# Patient Record
Sex: Female | Born: 2000 | Race: Black or African American | Hispanic: No | Marital: Single | Smoking: Never smoker
Health system: Southern US, Community
[De-identification: ages and names within clinical notes are randomized; demographics above are authoritative.]

## PROBLEM LIST (undated history)

## (undated) DIAGNOSIS — J45909 Unspecified asthma, uncomplicated: Secondary | ICD-10-CM

---

## 2020-02-13 ENCOUNTER — Emergency Department (HOSPITAL_COMMUNITY): Payer: 59

## 2020-02-13 ENCOUNTER — Emergency Department (HOSPITAL_COMMUNITY)
Admission: EM | Admit: 2020-02-13 | Discharge: 2020-02-13 | Disposition: A | Discharge status: Home / Self Care | Payer: 59 | Attending: Emergency Medicine | Admitting: Emergency Medicine

## 2020-02-13 ENCOUNTER — Other Ambulatory Visit: Payer: Self-pay

## 2020-02-13 ENCOUNTER — Ambulatory Visit (HOSPITAL_COMMUNITY)
Admission: EM | Admit: 2020-02-13 | Discharge: 2020-02-13 | Disposition: A | Discharge status: Home / Self Care | Payer: 59 | Source: Home / Self Care

## 2020-02-13 ENCOUNTER — Encounter (HOSPITAL_COMMUNITY): Payer: Self-pay | Admitting: Emergency Medicine

## 2020-02-13 DIAGNOSIS — W182XXA Fall in (into) shower or empty bathtub, initial encounter: Secondary | ICD-10-CM | POA: Insufficient documentation

## 2020-02-13 DIAGNOSIS — M25512 Pain in left shoulder: Secondary | ICD-10-CM | POA: Insufficient documentation

## 2020-02-13 DIAGNOSIS — R55 Syncope and collapse: Secondary | ICD-10-CM | POA: Diagnosis not present

## 2020-02-13 DIAGNOSIS — G43909 Migraine, unspecified, not intractable, without status migrainosus: Secondary | ICD-10-CM | POA: Insufficient documentation

## 2020-02-13 DIAGNOSIS — J45909 Unspecified asthma, uncomplicated: Secondary | ICD-10-CM | POA: Diagnosis not present

## 2020-02-13 DIAGNOSIS — W19XXXA Unspecified fall, initial encounter: Secondary | ICD-10-CM

## 2020-02-13 DIAGNOSIS — M25552 Pain in left hip: Secondary | ICD-10-CM | POA: Diagnosis not present

## 2020-02-13 DIAGNOSIS — G43009 Migraine without aura, not intractable, without status migrainosus: Secondary | ICD-10-CM

## 2020-02-13 HISTORY — DX: Unspecified asthma, uncomplicated: J45.909

## 2020-02-13 LAB — COMPREHENSIVE METABOLIC PANEL
ALT: 16 U/L (ref 0–44)
AST: 17 U/L (ref 15–41)
Albumin: 3.4 g/dL — ABNORMAL LOW (ref 3.5–5.0)
Alkaline Phosphatase: 80 U/L (ref 38–126)
Anion gap: 8 (ref 5–15)
BUN: 8 mg/dL (ref 6–20)
CO2: 22 mmol/L (ref 22–32)
Calcium: 8.9 mg/dL (ref 8.9–10.3)
Chloride: 108 mmol/L (ref 98–111)
Creatinine, Ser: 0.72 mg/dL (ref 0.44–1.00)
GFR calc Af Amer: >60 mL/min (ref >60)
GFR calc non Af Amer: >60 mL/min (ref >60)
Glucose, Bld: 80 mg/dL (ref 70–99)
Potassium: 3.7 mmol/L (ref 3.5–5.1)
Sodium: 138 mmol/L (ref 135–145)
Total Bilirubin: 0.3 mg/dL (ref 0.3–1.2)
Total Protein: 6.9 g/dL (ref 6.5–8.1)

## 2020-02-13 LAB — CBC WITH DIFFERENTIAL/PLATELET
Abs Immature Granulocytes: 0.01 10*3/uL (ref 0.00–0.07)
Basophils Absolute: 0.1 10*3/uL (ref 0.0–0.1)
Basophils Relative: 1 %
Eosinophils Absolute: 1 10*3/uL — ABNORMAL HIGH (ref 0.0–0.5)
Eosinophils Relative: 9 %
HCT: 37.2 % (ref 36.0–46.0)
Hemoglobin: 11.1 g/dL — ABNORMAL LOW (ref 12.0–15.0)
Immature Granulocytes: 0 %
Lymphocytes Relative: 32 %
Lymphs Abs: 3.5 10*3/uL (ref 0.7–4.0)
MCH: 21 pg — ABNORMAL LOW (ref 26.0–34.0)
MCHC: 29.8 g/dL — ABNORMAL LOW (ref 30.0–36.0)
MCV: 70.3 fL — ABNORMAL LOW (ref 80.0–100.0)
Monocytes Absolute: 0.7 10*3/uL (ref 0.1–1.0)
Monocytes Relative: 7 %
Neutro Abs: 5.8 10*3/uL (ref 1.7–7.7)
Neutrophils Relative %: 51 %
Platelets: 481 10*3/uL — ABNORMAL HIGH (ref 150–400)
RBC: 5.29 MIL/uL — ABNORMAL HIGH (ref 3.87–5.11)
RDW: 15.9 % — ABNORMAL HIGH (ref 11.5–15.5)
WBC: 11.1 10*3/uL — ABNORMAL HIGH (ref 4.0–10.5)
nRBC: 0 % (ref 0.0–0.2)

## 2020-02-13 LAB — I-STAT BETA HCG BLOOD, ED (MC, WL, AP ONLY): I-stat hCG, quantitative: <5 m[IU]/mL (ref <5)

## 2020-02-13 MED ORDER — IBUPROFEN 800 MG PO TABS: 800.0000 mg | ORAL_TABLET | Freq: Three times a day (TID) | ORAL | 0 refills | Status: DC

## 2020-02-13 MED ORDER — KETOROLAC TROMETHAMINE 30 MG/ML IJ SOLN
30.0000 mg | Freq: Once | INTRAMUSCULAR | Status: AC
  Administered 2020-02-13: 30 mg via INTRAMUSCULAR
  Filled 2020-02-13: qty 1

## 2020-02-13 NOTE — Discharge Instructions (Signed)

## 2020-02-13 NOTE — ED Notes (Signed)
Patient is being discharged from the [facility] and sent to the Emergency Department. Per [place], [place], patient is stable but in need of higher level of care due to LOC and fall while showering this morning; pt endorses headache, significant other driving pt to the ED. Patient is aware and verbalizes understanding of plan of care.

## 2020-02-13 NOTE — ED Triage Notes (Signed)
Pt reports having a migraine today. States she was taking a shower and passed out for a few seconds. Endorses left sided arm and butt pain from falling in the shower.

## 2020-02-13 NOTE — ED Provider Notes (Signed)
Emergency Department Provider Note   I have reviewed the triage vital signs and the nursing notes.   HISTORY  Chief Complaint Migraine   HPI Gwendolyn Lane is a 19 y.o. female presents to the emergency department for evaluation of headache with syncope event while in the shower.  The patient states that she developed a headache that is typical for her in the past.  She develops a bilateral throbbing type headache which seems centered behind the eyes.  She has not had syncope with this headache before.  She denies any sudden onset, maximal intensity headache symptoms.  Denies any unilateral weakness or numbness.  She is not experiencing fever.  She initially presented to urgent care but was referred to the emergency department for further evaluation.  She has not taken any medication for her headache.  Denies any vision symptoms.  Prior to passing out she did not experience chest pain, shortness of breath, heart palpitations.  She does note that when getting in the shower recently she has developed some lightheaded type feelings but no full syncope.  She has been advised to see a neurologist in the past but has not had a chance to follow-up regarding her HA.    Past Medical History:  Diagnosis Date  . Asthma     There are no problems to display for this patient.   History reviewed. No pertinent surgical history.  Allergies Patient has no allergy information on record.  No family history on file.  Social History Social History   Tobacco Use  . Smoking status: Not on file  Substance Use Topics  . Alcohol use: Not on file  . Drug use: Not on file    Review of Systems  Constitutional: No fever/chills Eyes: No visual changes. ENT: No sore throat. Cardiovascular: Denies chest pain. Positive syncope.  Respiratory: Denies shortness of breath. Gastrointestinal: No abdominal pain.  No nausea, no vomiting.  No diarrhea.  No constipation. Genitourinary: Negative for  dysuria. Musculoskeletal: Negative for back pain. Skin: Negative for rash. Neurological: Negative for focal weakness or numbness. Positive HA.   10-point ROS otherwise negative.  ____________________________________________   PHYSICAL EXAM:  VITAL SIGNS: ED Triage Vitals  Enc Vitals Group     BP 02/13/20 1335 126/79     Pulse Rate 02/13/20 1335 86     Resp 02/13/20 1335 18     Temp 02/13/20 1335 98.9 F (37.2 C)     Temp Source 02/13/20 1335 Oral     SpO2 02/13/20 1335 100 %     Weight --      Height 02/13/20 1339 5\' 7"  (1.702 m)   Constitutional: Alert and oriented. Well appearing and in no acute distress. Eyes: Conjunctivae are normal. Head: Atraumatic. Nose: No congestion/rhinnorhea. Mouth/Throat: Mucous membranes are moist.  Neck: No stridor. No cervical spine tenderness to palpation. Cardiovascular: Normal rate, regular rhythm. Good peripheral circulation. Grossly normal heart sounds.   Respiratory: Normal respiratory effort.  No retractions. Lungs CTAB. Gastrointestinal: Soft and nontender. No distention.  Musculoskeletal: No lower extremity tenderness nor edema. No gross deformities of extremities. Tenderness to palpation over the left anterior shoulder. No elbow or wrist tenderness on the left. Mild tenderness over the left lateral hip. Normal ROM of the hips, knees, and ankles.  Neurologic:  Normal speech and language. No gross focal neurologic deficits are appreciated.  Skin:  Skin is warm, dry and intact. No rash noted.   ____________________________________________   LABS (all labs ordered are listed, but  only abnormal results are displayed)  Labs Reviewed  COMPREHENSIVE METABOLIC PANEL - Abnormal; Notable for the following components:      Result Value   Albumin 3.4 (*)    All other components within normal limits  CBC WITH DIFFERENTIAL/PLATELET - Abnormal; Notable for the following components:   WBC 11.1 (*)    RBC 5.29 (*)    Hemoglobin 11.1 (*)     MCV 70.3 (*)    MCH 21.0 (*)    MCHC 29.8 (*)    RDW 15.9 (*)    Platelets 481 (*)    Eosinophils Absolute 1.0 (*)    All other components within normal limits  I-STAT BETA HCG BLOOD, ED ([facility], [facility], [facility] ONLY)   ____________________________________________  EKG   EKG Interpretation  Date/Time:  Monday February 13 2020 15:56:56 EST Ventricular Rate:  79 PR Interval:  164 QRS Duration: 88 QT Interval:  370 QTC Calculation: 424 R Axis:   80 Text Interpretation: Normal sinus rhythm with sinus arrhythmia Normal ECG No STEMI Confirmed by Alona Bene [phone]) on 02/13/2020 4:42:28 PM       ____________________________________________  RADIOLOGY  DG Chest 2 View  Result Date: 02/13/2020 CLINICAL DATA:  Fall, syncope EXAM: CHEST - 2 VIEW COMPARISON:  None. FINDINGS: The heart size and mediastinal contours are within normal limits. Both lungs are clear. The visualized skeletal structures are unremarkable. IMPRESSION: No active cardiopulmonary disease. Electronically Signed   By: Jasmine Pang M.D.   On: 02/13/2020 17:51   DG Shoulder Left  Result Date: 02/13/2020 CLINICAL DATA:  Fall, syncope EXAM: LEFT SHOULDER - 2+ VIEW COMPARISON:  None. FINDINGS: There is no evidence of fracture or dislocation. There is no evidence of arthropathy or other focal bone abnormality. Soft tissues are unremarkable. IMPRESSION: Negative. Electronically Signed   By: Jasmine Pang M.D.   On: 02/13/2020 17:52   DG Hip Unilat W or Wo Pelvis 2-3 Views Left  Result Date: 02/13/2020 CLINICAL DATA:  Fall, syncope EXAM: DG HIP (WITH OR WITHOUT PELVIS) 2-3V LEFT COMPARISON:  None. FINDINGS: There is no evidence of hip fracture or dislocation. There is no evidence of arthropathy or other focal bone abnormality. IMPRESSION: Negative. Electronically Signed   By: Jasmine Pang M.D.   On: 02/13/2020 17:52    ____________________________________________   PROCEDURES  Procedure(s) performed:    Procedures  None ____________________________________________   INITIAL IMPRESSION / ASSESSMENT AND PLAN / ED COURSE  Pertinent labs & imaging results that were available during my care of the patient were reviewed by me and considered in my medical decision making (see chart for details).   Patient presents to the ED with gradual onset HA typical of her prior HA symptoms with syncope. Syncope appears to be vasovagal in etiology. Normal EKG and labs. Imaging reviewed with no acute findings. Patient given Toradol for HA symptoms and MSK pain. Contact info given for local Neurology group and patient encouraged to call for close follow up. Discussed ED return precautions along with PCP follow up for syncope symptoms to assess and follow symptoms with [facility] referral from there.    ____________________________________________  FINAL CLINICAL IMPRESSION(S) / ED DIAGNOSES  Final diagnoses:  Migraine without aura and without status migrainosus, not intractable  Syncope, unspecified syncope type  Fall, initial encounter     MEDICATIONS GIVEN DURING THIS VISIT:  Medications  ketorolac (TORADOL) 30 MG/ML injection 30 mg (30 mg Intramuscular Given 02/13/20 1702)     NEW OUTPATIENT MEDICATIONS STARTED DURING  THIS VISIT:  Discharge Medication List as of 02/13/2020  6:10 PM    START taking these medications   Details  ibuprofen (ADVIL) 800 MG tablet Take 1 tablet (800 mg total) by mouth 3 (three) times daily., Starting Mon 02/13/2020, Print        Note:  This document was prepared using Dragon voice recognition software and may include unintentional dictation errors.  Nanda Quinton, MD, [facility] Emergency Medicine    Katelin Kutsch, [facility], MD 02/14/20 [phone]

## 2020-04-05 ENCOUNTER — Ambulatory Visit: Payer: 59

## 2020-07-11 ENCOUNTER — Other Ambulatory Visit: Payer: Self-pay

## 2020-07-11 ENCOUNTER — Ambulatory Visit (HOSPITAL_COMMUNITY)
Admission: EM | Admit: 2020-07-11 | Discharge: 2020-07-11 | Disposition: A | Discharge status: Home / Self Care | Payer: 59 | Attending: Family Medicine | Admitting: Family Medicine

## 2020-07-11 ENCOUNTER — Encounter (HOSPITAL_COMMUNITY): Payer: Self-pay

## 2020-07-11 DIAGNOSIS — Z791 Long term (current) use of non-steroidal anti-inflammatories (NSAID): Secondary | ICD-10-CM | POA: Insufficient documentation

## 2020-07-11 DIAGNOSIS — Z79899 Other long term (current) drug therapy: Secondary | ICD-10-CM | POA: Insufficient documentation

## 2020-07-11 DIAGNOSIS — Z20822 Contact with and (suspected) exposure to covid-19: Secondary | ICD-10-CM | POA: Insufficient documentation

## 2020-07-11 DIAGNOSIS — J069 Acute upper respiratory infection, unspecified: Secondary | ICD-10-CM | POA: Diagnosis not present

## 2020-07-11 DIAGNOSIS — R0981 Nasal congestion: Secondary | ICD-10-CM | POA: Diagnosis present

## 2020-07-11 DIAGNOSIS — J45909 Unspecified asthma, uncomplicated: Secondary | ICD-10-CM | POA: Diagnosis not present

## 2020-07-11 MED ORDER — FLUTICASONE PROPIONATE 50 MCG/ACT NA SUSP: 1.0000 | Freq: Every day | NASAL | 2 refills | Status: DC

## 2020-07-11 NOTE — Discharge Instructions (Addendum)
Use Flonase and Claritin or Zyrtec for symptomatic relief

## 2020-07-11 NOTE — ED Provider Notes (Signed)
[facility]    CSN: [id number] Arrival date & time: 07/11/20  1947      History   Chief Complaint Chief Complaint  Patient presents with  . Nasal Congestion    HPI Gwendolyn Lane is a 19 y.o. female.   Patient has 2-day history of nasal congestion.  She has not tried any antihistamine but does take Claritin seasonally for allergic rhinitis.  She denies cough fever sinus congestion.  HPI  Past Medical History:  Diagnosis Date  . Asthma     There are no problems to display for this patient.   History reviewed. No pertinent surgical history.  OB History   No obstetric history on file.      Home Medications    Prior to Admission medications   Medication Sig Start Date End Date Taking? Authorizing Provider  Pseudoeph-Doxylamine-DM-APAP (NYQUIL PO) Take by mouth.   Yes [provider]  fluticasone (FLONASE) 50 MCG/ACT nasal spray Place 1 spray into both nostrils daily. 07/11/20   Frederica Kuster, MD  ibuprofen (ADVIL) 800 MG tablet Take 1 tablet (800 mg total) by mouth 3 (three) times daily. 02/13/20   Long, Arlyss Repress, MD    Family History History reviewed. No pertinent family history.  Social History Social History   Tobacco Use  . Smoking status: Never Smoker  . Smokeless tobacco: Never Used  Substance Use Topics  . Alcohol use: Not on file  . Drug use: Not on file     Allergies   Patient has no known allergies.   Review of Systems Review of Systems  HENT: Positive for postnasal drip.   Respiratory: Negative for cough.   All other systems reviewed and are negative.    Physical Exam Triage Vital Signs ED Triage Vitals  Enc Vitals Group     BP 07/11/20 2017 (!) 149/96     Pulse Rate 07/11/20 2017 98     Resp 07/11/20 2017 19     Temp 07/11/20 2017 99.3 F (37.4 C)     Temp Source 07/11/20 2017 Oral     SpO2 07/11/20 2017 100 %     Weight --      Height --      Head Circumference --      Peak Flow --      Pain  Score 07/11/20 2015 0     Pain Loc --      Pain Edu? --      Excl. in GC? --    No data found.  Updated Vital Signs BP (!) 149/96 (BP Location: Left Wrist)   Pulse 98   Temp 99.3 F (37.4 C) (Oral)   Resp 19   LMP 07/11/2020 (Exact Date)   SpO2 100%   Visual Acuity Right Eye Distance:   Left Eye Distance:   Bilateral Distance:    Right Eye Near:   Left Eye Near:    Bilateral Near:     Physical Exam Vitals and nursing note reviewed.  Constitutional:      Appearance: Normal appearance. She is obese.  HENT:     Right Ear: Tympanic membrane normal.     Nose: Nose normal.     Mouth/Throat:     Mouth: Mucous membranes are moist.  Cardiovascular:     Rate and Rhythm: Normal rate and regular rhythm.  Pulmonary:     Effort: Pulmonary effort is normal.     Breath sounds: Normal breath sounds.  Neurological:  General: No focal deficit present.     Mental Status: She is alert and oriented to person, place, and time.      UC Treatments / Results  Labs (all labs ordered are listed, but only abnormal results are displayed) Labs Reviewed  SARS CORONAVIRUS 2 (TAT 6-24 HRS)    EKG   Radiology No results found.  Procedures Procedures (including critical care time)  Medications Ordered in UC Medications - No data to display  Initial Impression / Assessment and Plan / UC Course  I have reviewed the triage vital signs and the nursing notes.  Pertinent labs & imaging results that were available during my care of the patient were reviewed by me and considered in my medical decision making (see chart for details).     URI Final Clinical Impressions(s) / UC Diagnoses   Final diagnoses:  Viral upper respiratory tract infection     Discharge Instructions     Use Flonase and Claritin or Zyrtec for symptomatic relief   ED Prescriptions    Medication Sig Dispense Auth. Provider   fluticasone (FLONASE) 50 MCG/ACT nasal spray Place 1 spray into both nostrils  daily. 9.9 mL Frederica Kuster, MD     PDMP not reviewed this encounter.   Frederica Kuster, MD 07/11/20 2045

## 2020-07-11 NOTE — ED Triage Notes (Signed)
Pt presents with nasal congestion x 2 days. NyQuil gives somewhat relief.

## 2020-07-12 LAB — SARS CORONAVIRUS 2 (TAT 6-24 HRS): SARS Coronavirus 2: NEGATIVE

## 2020-10-23 ENCOUNTER — Other Ambulatory Visit: Payer: Self-pay

## 2020-10-23 ENCOUNTER — Emergency Department (HOSPITAL_COMMUNITY)
Admission: EM | Admit: 2020-10-23 | Discharge: 2020-10-24 | Disposition: A | Discharge status: Home / Self Care | Payer: 59 | Attending: Emergency Medicine | Admitting: Emergency Medicine

## 2020-10-23 DIAGNOSIS — Z5321 Procedure and treatment not carried out due to patient leaving prior to being seen by health care provider: Secondary | ICD-10-CM | POA: Insufficient documentation

## 2020-10-23 DIAGNOSIS — G43909 Migraine, unspecified, not intractable, without status migrainosus: Secondary | ICD-10-CM | POA: Diagnosis not present

## 2020-10-23 NOTE — ED Triage Notes (Signed)
C/O of migraines. Stated was advised by UC to come to ED if unresolved by caffeine pills. Patient then stated, she was unable to obtain prescriptions from pharmacy. Stated tylenol provided some relief.

## 2021-03-13 ENCOUNTER — Encounter (HOSPITAL_COMMUNITY): Payer: Self-pay

## 2021-03-13 ENCOUNTER — Other Ambulatory Visit: Payer: Self-pay

## 2021-03-13 ENCOUNTER — Ambulatory Visit (HOSPITAL_COMMUNITY)
Admission: EM | Admit: 2021-03-13 | Discharge: 2021-03-13 | Disposition: A | Discharge status: Home / Self Care | Payer: 59 | Attending: Medical Oncology | Admitting: Medical Oncology

## 2021-03-13 DIAGNOSIS — A084 Viral intestinal infection, unspecified: Secondary | ICD-10-CM

## 2021-03-13 MED ORDER — ONDANSETRON 4 MG PO TBDP: 4.0000 mg | ORAL_TABLET | Freq: Three times a day (TID) | ORAL | 0 refills | Status: DC | PRN

## 2021-03-13 MED ORDER — OMEPRAZOLE 20 MG PO CPDR: 20.0000 mg | DELAYED_RELEASE_CAPSULE | Freq: Every day | ORAL | 0 refills | Status: DC

## 2021-03-13 NOTE — ED Provider Notes (Signed)
[facility]    CSN: [id number] Arrival date & time: 03/13/21  1507      History   Chief Complaint Chief Complaint  Patient presents with  . Vomiting  . Abdominal Pain    HPI Gwendolyn Lane is a 20 y.o. female.   HPI  Vomiting: Pt states that for the past 3 days she has had GI upset. Symptoms started with constipation. She was seen by [facility] who recommended an antacid and a laxative. Now she has had one episode of vomiting her last meal and diarrhea. She reports that she feels hungry at times but when she eat she feels full and bloated quickly. Some nausea remains. No known sick contacts.    Past Medical History:  Diagnosis Date  . Asthma     There are no problems to display for this patient.   History reviewed. No pertinent surgical history.  OB History   No obstetric history on file.      Home Medications    Prior to Admission medications   Medication Sig Start Date End Date Taking? Authorizing Provider  omeprazole (PRILOSEC) 20 MG capsule Take 1 capsule (20 mg total) by mouth daily. Take 1 hour before breakfast with water 03/13/21  Yes Clent Jacks M, PA-C  ondansetron (ZOFRAN ODT) 4 MG disintegrating tablet Take 1 tablet (4 mg total) by mouth every 8 (eight) hours as needed for nausea or vomiting. 03/13/21  Yes Saree Krogh M, PA-C  fluticasone [facility]) 50 MCG/ACT nasal spray Place 1 spray into both nostrils daily. 07/11/20   Frederica Kuster, MD  ibuprofen (ADVIL) 800 MG tablet Take 1 tablet (800 mg total) by mouth 3 (three) times daily. 02/13/20   Long, Arlyss Repress, MD  Pseudoeph-Doxylamine-DM-APAP (NYQUIL PO) Take by mouth.    [provider]    Family History History reviewed. No pertinent family history.  Social History Social History   Tobacco Use  . Smoking status: Never Smoker  . Smokeless tobacco: Never Used     Allergies   Patient has no known allergies.   Review of Systems Review of Systems  As  stated above in HPI Physical Exam Triage Vital Signs ED Triage Vitals  Enc Vitals Group     BP 03/13/21 1541 119/81     Pulse Rate 03/13/21 1541 75     Resp 03/13/21 1541 17     Temp 03/13/21 1541 98.4 F (36.9 C)     Temp Source 03/13/21 1541 Oral     SpO2 03/13/21 1541 98 %     Weight --      Height --      Head Circumference --      Peak Flow --      Pain Score 03/13/21 1540 0     Pain Loc --      Pain Edu? --      Excl. in GC? --    No data found.  Updated Vital Signs BP 119/81 (BP Location: Right Arm)   Pulse 75   Temp 98.4 F (36.9 C) (Oral)   Resp 17   LMP 02/01/2021 (Exact Date)   SpO2 98%   Physical Exam Vitals and nursing note reviewed.  Constitutional:      General: She is not in acute distress.    Appearance: She is well-developed. She is obese. She is not ill-appearing, toxic-appearing or diaphoretic.  HENT:     Head: Normocephalic and atraumatic.  Eyes:     Comments: No pallor  Cardiovascular:     Rate and Rhythm: Normal rate and regular rhythm.     Heart sounds: Normal heart sounds.  Pulmonary:     Breath sounds: Normal breath sounds.  Abdominal:     General: Bowel sounds are normal. There is no distension.     Palpations: Abdomen is soft.     Tenderness: There is no abdominal tenderness. There is no right CVA tenderness, left CVA tenderness, guarding or rebound. Negative signs include Murphy's sign, Rovsing's sign, McBurney's sign, psoas sign and obturator sign.     Hernia: No hernia is present.  Neurological:     Mental Status: She is alert and oriented to person, place, and time.     Motor: No weakness.      UC Treatments / Results  Labs (all labs ordered are listed, but only abnormal results are displayed) Labs Reviewed - No data to display  EKG   Radiology No results found.  Procedures Procedures (including critical care time)  Medications Ordered in UC Medications - No data to display  Initial Impression / Assessment and  Plan / UC Course  I have reviewed the triage vital signs and the nursing notes.  Pertinent labs & imaging results that were available during my care of the patient were reviewed by me and considered in my medical decision making (see chart for details).     New. Treating for viral gastroenteritis. Discussed. Treating with omeprazole and zofran. Avoid laxative for now. Bland diet discussed. Reviewed red flag signs and symptoms.    Final Clinical Impressions(s) / UC Diagnoses   Final diagnoses:  Viral gastroenteritis   Discharge Instructions   None    ED Prescriptions    Medication Sig Dispense Auth. Provider   ondansetron (ZOFRAN ODT) 4 MG disintegrating tablet Take 1 tablet (4 mg total) by mouth every 8 (eight) hours as needed for nausea or vomiting. 20 tablet Angy Swearengin M, PA-C   omeprazole (PRILOSEC) 20 MG capsule Take 1 capsule (20 mg total) by mouth daily. Take 1 hour before breakfast with water 14 capsule Rushie Chestnut, [place]     PDMP not reviewed this encounter.   Rushie Chestnut, [place] 03/13/21 1627

## 2021-03-13 NOTE — ED Triage Notes (Signed)
t c/o stomach issues x 3 days. Pt states she has not been able to eat. She states she was vomiting yesterday and states when she eats she feels like she has not eaten at all.

## 2021-05-09 IMAGING — CR DG CHEST 2V
2 series · 2 of 2 positions shown · non-contrast
Comparison: None.

CLINICAL DATA: Fall, syncope

EXAM:
CHEST - 2 VIEW

[chest pa]
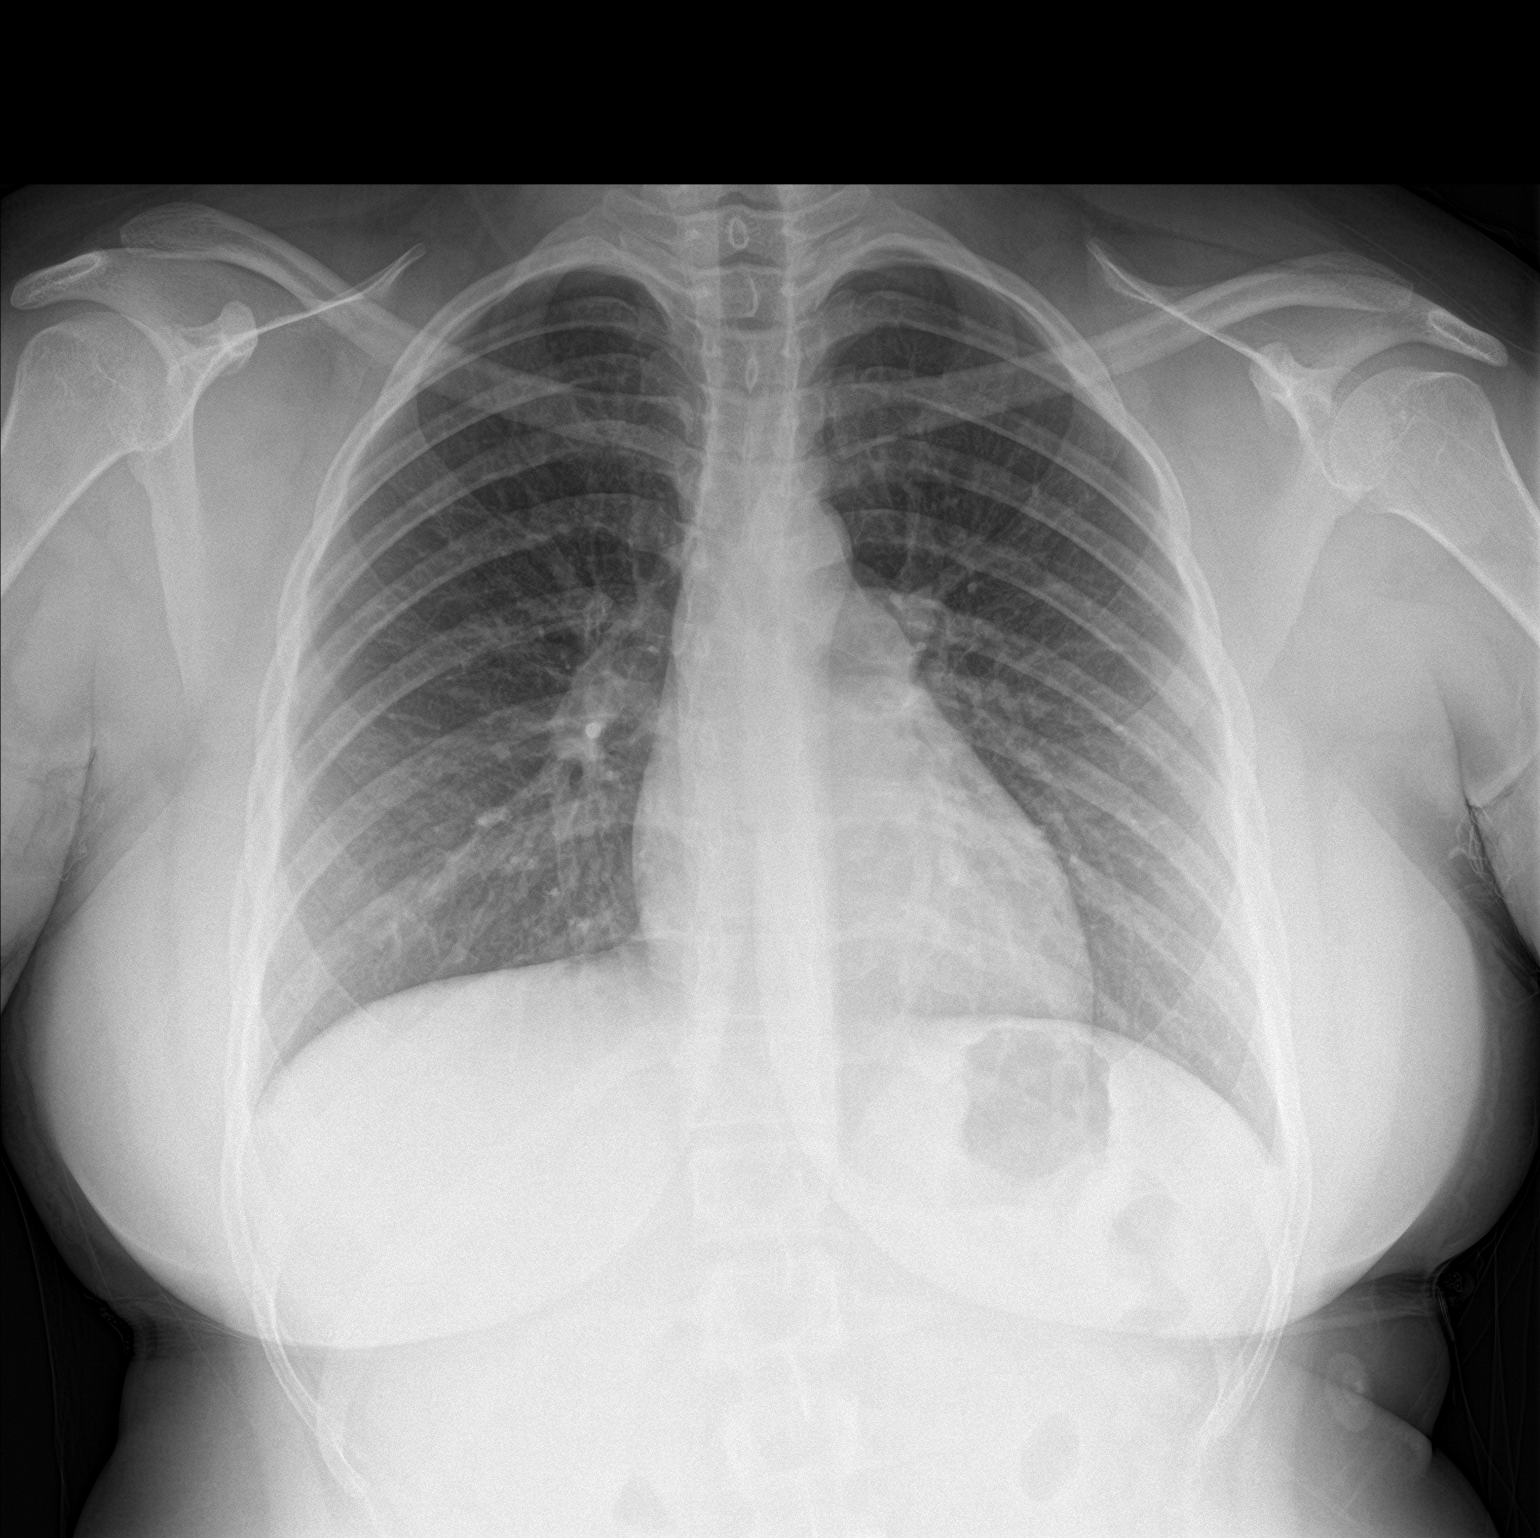

[chest lat]
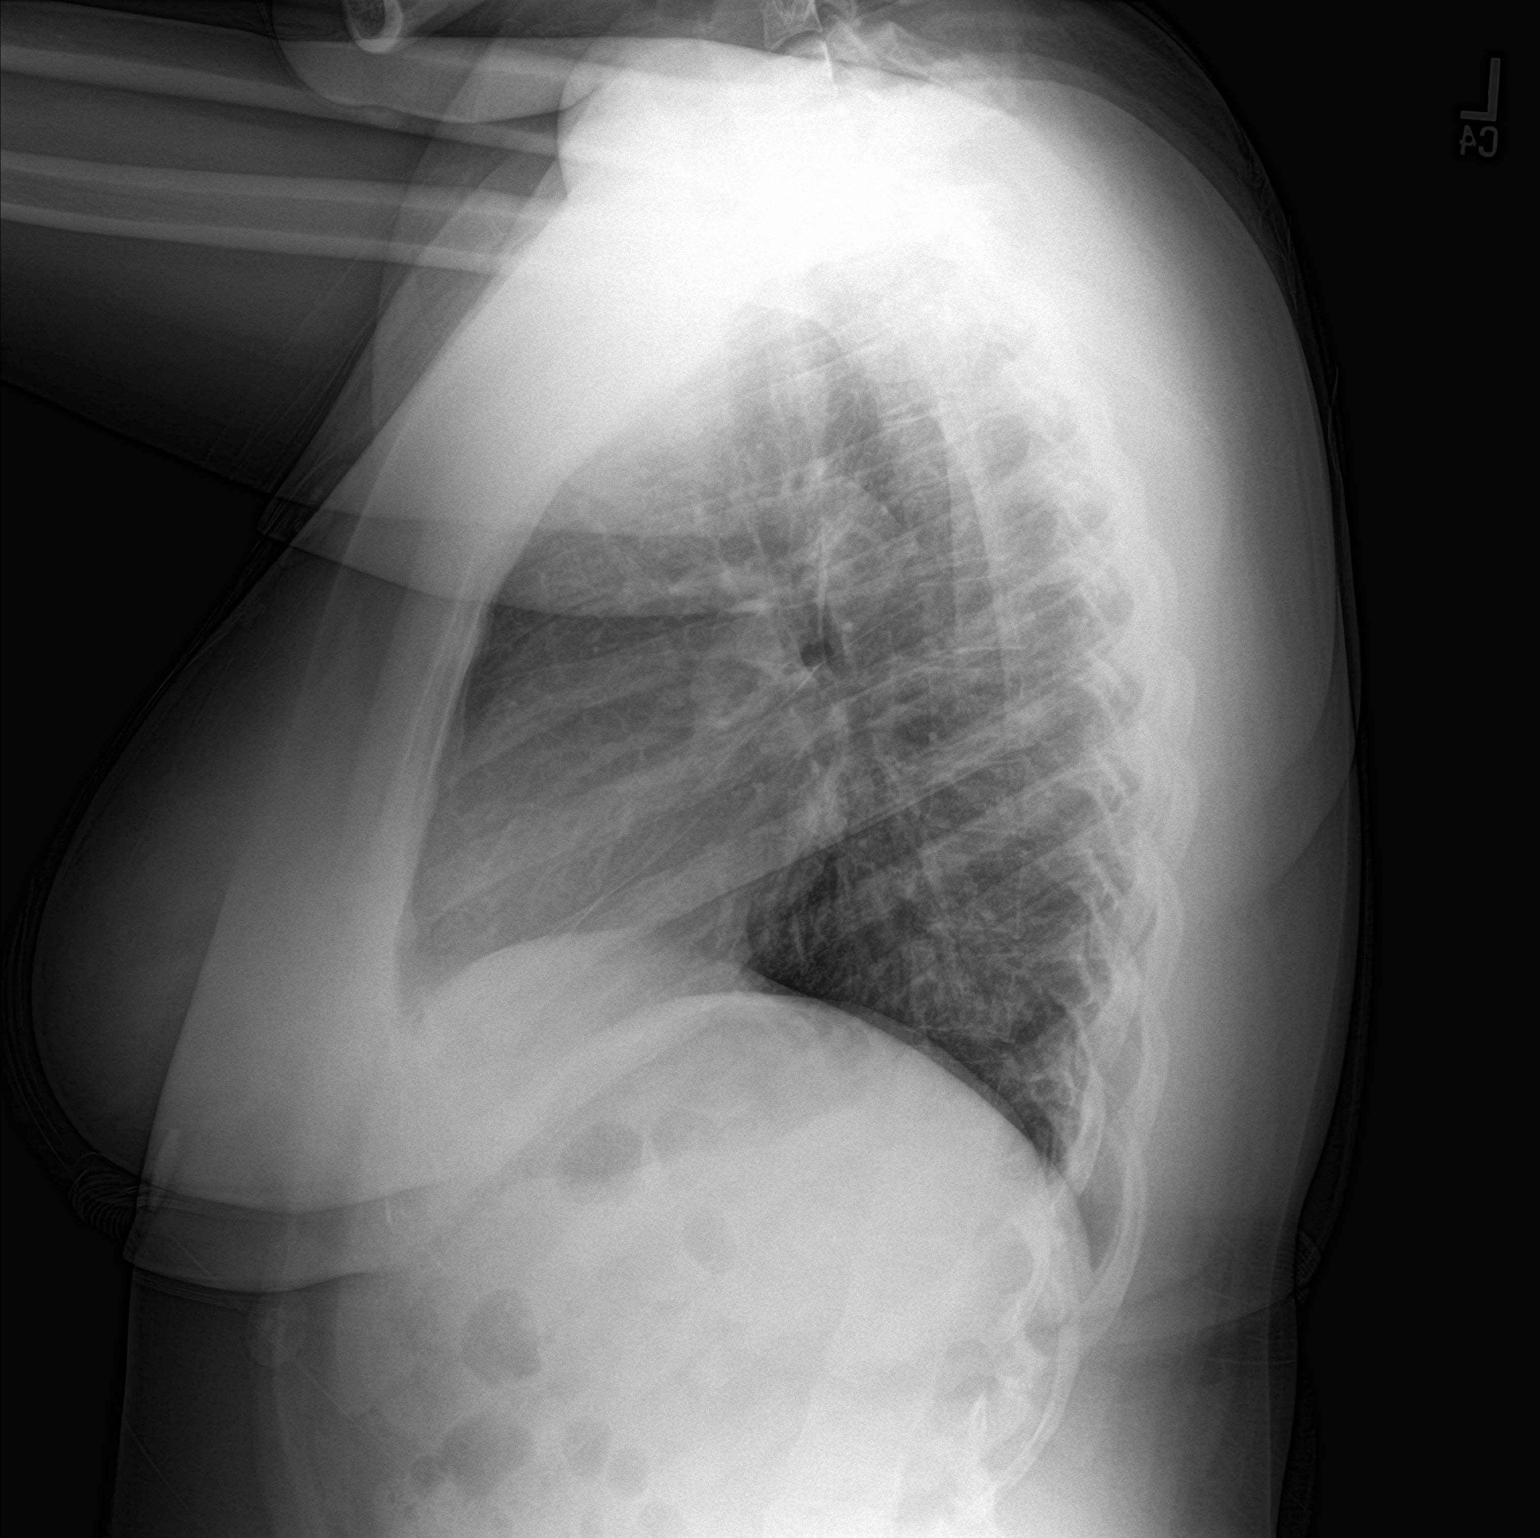

[2 of 2 positions shown; findings below may reference images not displayed]

FINDINGS: The heart size and mediastinal contours are within normal limits.
Both lungs are clear. The visualized skeletal structures are
unremarkable.
IMPRESSION: No active cardiopulmonary disease.

## 2021-05-09 IMAGING — CR DG HIP (WITH OR WITHOUT PELVIS) 2-3V*L*
3 series · 3 of 3 positions shown · non-contrast
Comparison: None.

CLINICAL DATA: Fall, syncope

EXAM:
DG HIP (WITH OR WITHOUT PELVIS) 2-3V LEFT

[pelvis ap]
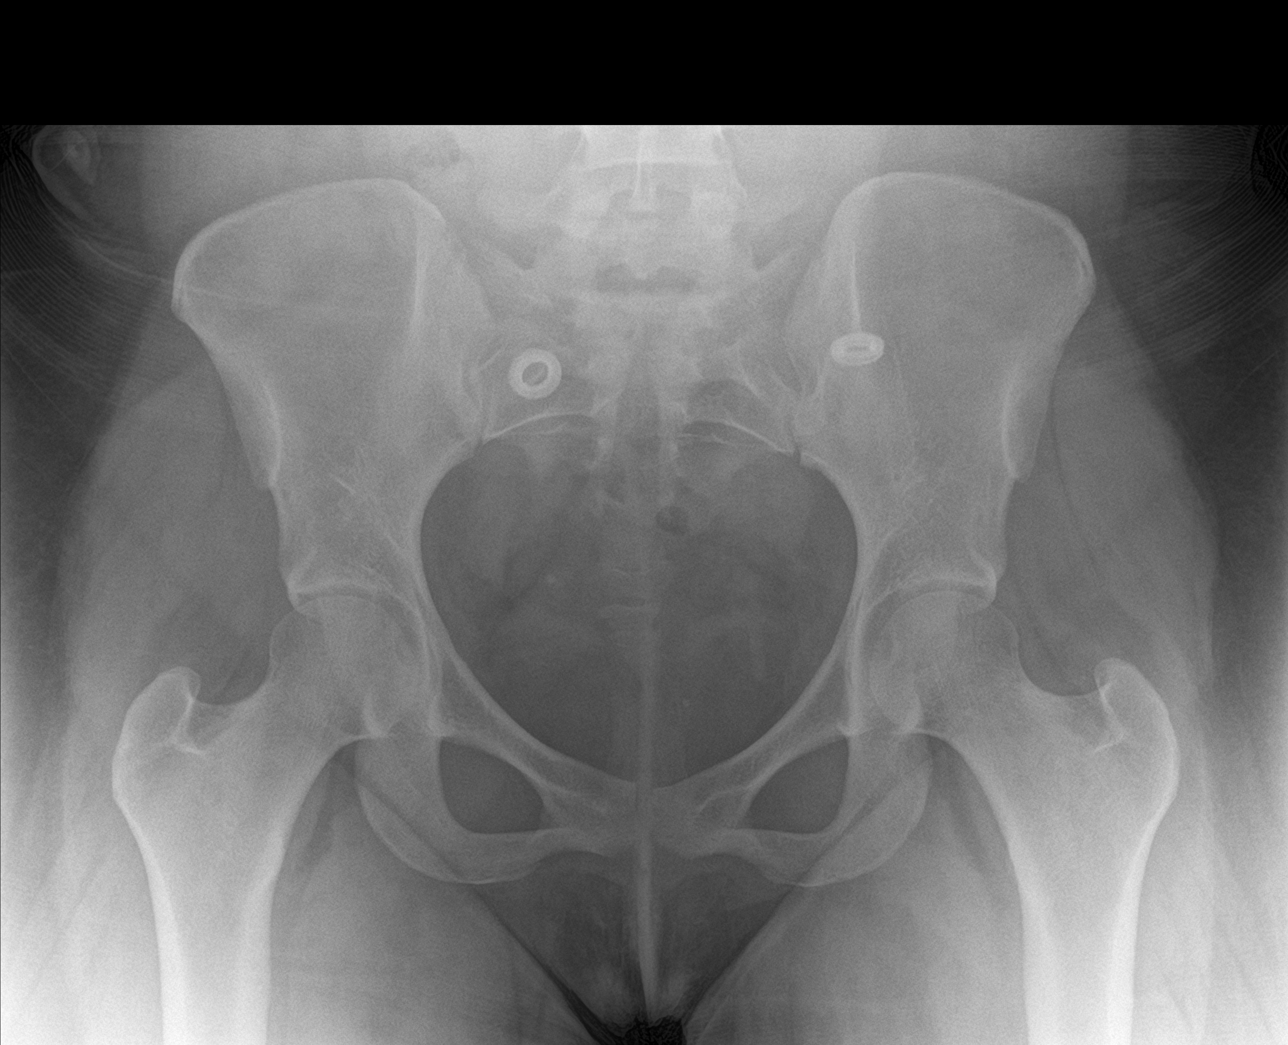

[hip ap]
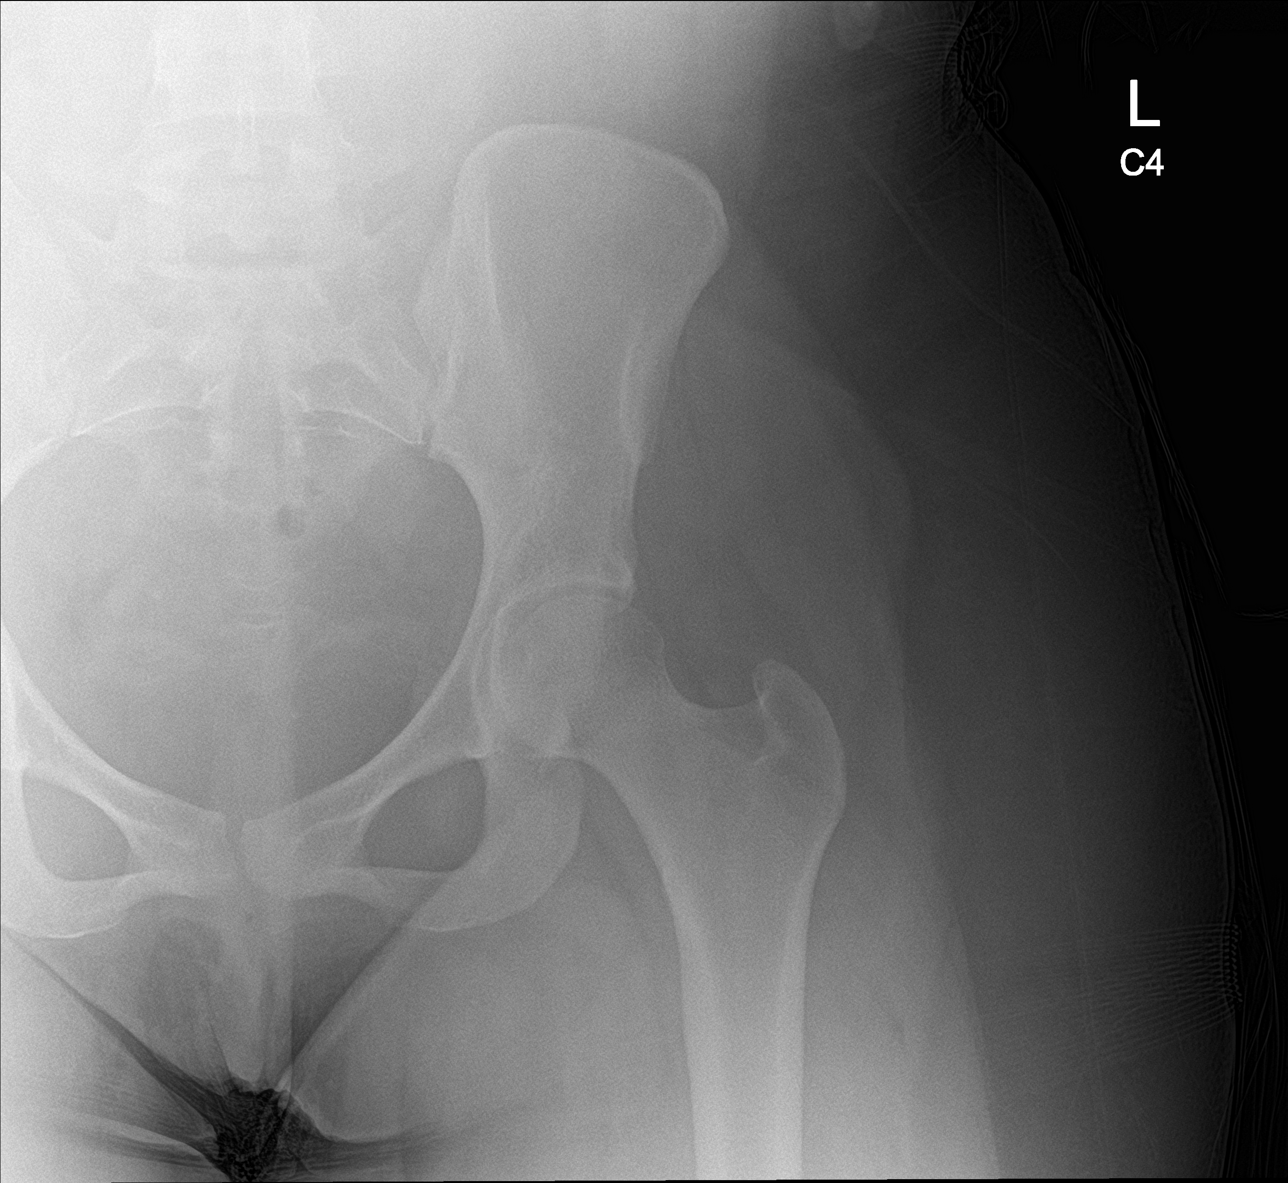

[hip lat]
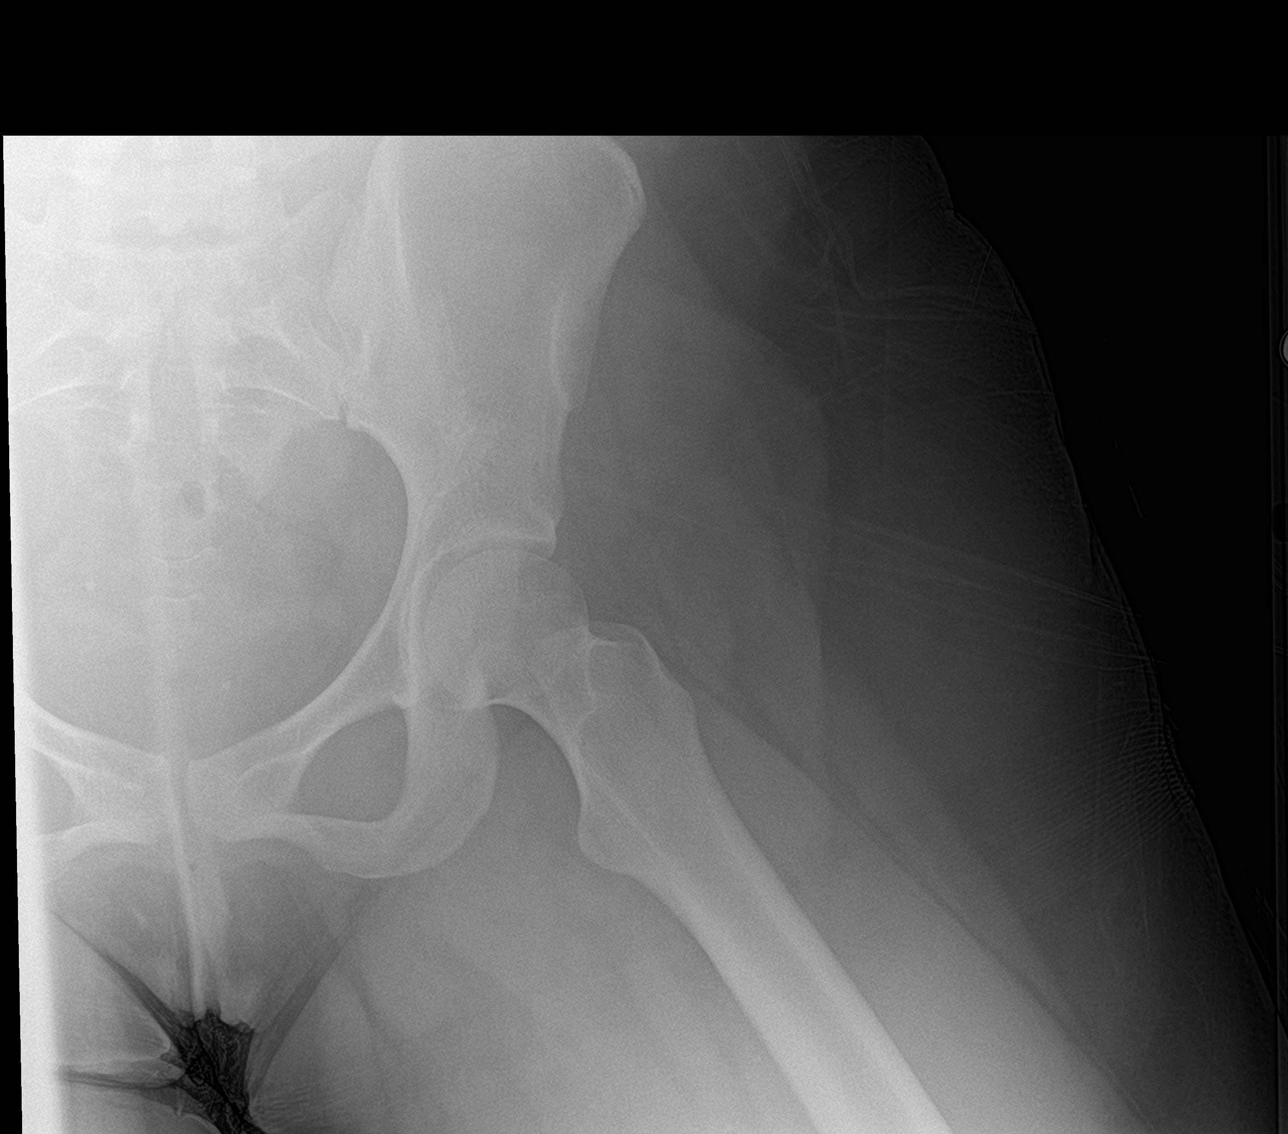

[3 of 3 positions shown; findings below may reference images not displayed]

FINDINGS: There is no evidence of hip fracture or dislocation. There is no
evidence of arthropathy or other focal bone abnormality.
IMPRESSION: Negative.

## 2021-10-23 ENCOUNTER — Ambulatory Visit: Admission: EM | Admit: 2021-10-23 | Discharge: 2021-10-23 | Discharge status: Absent without leave | Payer: 59

## 2021-10-23 DIAGNOSIS — R109 Unspecified abdominal pain: Secondary | ICD-10-CM | POA: Diagnosis not present

## 2021-10-23 NOTE — ED Notes (Signed)
Attempt to call from lobby, no response.   

## 2022-01-09 ENCOUNTER — Ambulatory Visit
Admission: EM | Admit: 2022-01-09 | Discharge: 2022-01-09 | Disposition: A | Discharge status: Home / Self Care | Payer: 59

## 2022-01-09 ENCOUNTER — Other Ambulatory Visit: Payer: Self-pay

## 2022-01-09 DIAGNOSIS — J3089 Other allergic rhinitis: Secondary | ICD-10-CM | POA: Diagnosis not present

## 2022-01-09 DIAGNOSIS — J302 Other seasonal allergic rhinitis: Secondary | ICD-10-CM | POA: Diagnosis not present

## 2022-01-09 MED ORDER — METHYLPREDNISOLONE SODIUM SUCC 125 MG IJ SOLR
80.0000 mg | Freq: Once | INTRAMUSCULAR | Status: AC
  Administered 2022-01-09: 80 mg via INTRAMUSCULAR

## 2022-01-09 MED ORDER — IBUPROFEN 400 MG PO TABS: 400.0000 mg | ORAL_TABLET | Freq: Three times a day (TID) | ORAL | 2 refills | Status: DC | PRN

## 2022-01-09 MED ORDER — MOMETASONE FUROATE 50 MCG/ACT NA SUSP: 2.0000 | Freq: Every day | NASAL | 5 refills | Status: DC

## 2022-01-09 MED ORDER — FEXOFENADINE HCL 180 MG PO TABS: 180.0000 mg | ORAL_TABLET | Freq: Every day | ORAL | 5 refills | Status: DC

## 2022-01-09 NOTE — ED Triage Notes (Addendum)
Pt reports having trouble breathing through nose and mouth d/t congestion. Pt also reports having night sweats.  Started: yesterday

## 2022-01-09 NOTE — ED Provider Notes (Signed)
[facility]    CSN: [id number] Arrival date & time: 01/09/22  0813    HISTORY   Chief Complaint  Patient presents with   Nasal Congestion   HPI Gwendolyn Lane is a 21 y.o. female. Patient states that yesterday she began to have difficulty breathing through her nose and mouth due to congestion, states she is also having night sweats.  EMR reviewed, patient has a history of allergic rhinitis, has been prescribed Flonase, Claritin and albuterol in the past.  Patient states she has also been on Zyrtec, Allegra and Nasonex in the past.  Patient states she is not currently taking any of these medications.  Patient denies fever, aches, chills, nausea, vomiting, diarrhea, productive cough, sore throat.  She states occasionally she will use sinus rinses, states this is sometimes helpful.  Patient states the pollen is something she is particular allergic to.  Patient states that she usually does not have symptoms a later time but this morning feels like its pollen season.  Patient states she is using Mucinex, Advil Cold and Sinus at this time with no relief.  The history is provided by the patient.  Past Medical History:  Diagnosis Date   Asthma    There are no problems to display for this patient.  History reviewed. No pertinent surgical history. OB History   No obstetric history on file.    Home Medications    Prior to Admission medications   Medication Sig Start Date End Date Taking? Authorizing Provider  albuterol (VENTOLIN HFA) 108 (90 Base) MCG/ACT inhaler Inhale into the lungs every 6 (six) hours as needed for wheezing or shortness of breath.   Yes [provider]   Family History History reviewed. No pertinent family history. Social History Social History   Tobacco Use   Smoking status: Never   Smokeless tobacco: Never  Vaping Use   Vaping Use: Never used  Substance Use Topics   Alcohol use: Yes   Drug use: Yes    Types: Marijuana   Allergies    Patient has no known allergies.  Review of Systems Review of Systems Pertinent findings noted in history of present illness.   Physical Exam Triage Vital Signs ED Triage Vitals  Enc Vitals Group     BP 10/18/21 0827 (!) 147/82     Pulse Rate 10/18/21 0827 72     Resp 10/18/21 0827 18     Temp 10/18/21 0827 98.3 F (36.8 C)     Temp Source 10/18/21 0827 Oral     SpO2 10/18/21 0827 98 %     Weight --      Height --      Head Circumference --      Peak Flow --      Pain Score 10/18/21 0826 5     Pain Loc --      Pain Edu? --      Excl. in GC? --   No data found.  Updated Vital Signs BP 120/78 (BP Location: Left Arm)    Pulse 96    Temp 98.7 F (37.1 C) (Oral)    Resp 18    SpO2 97%   Physical Exam Vitals and nursing note reviewed.  Constitutional:      General: She is not in acute distress.    Appearance: Normal appearance. She is not ill-appearing.  HENT:     Head: Normocephalic and atraumatic.     Salivary Glands: Right salivary gland is not diffusely enlarged or  tender. Left salivary gland is not diffusely enlarged or tender.     Right Ear: Ear canal and external ear normal. No drainage. A middle ear effusion is present. There is no impacted cerumen. Tympanic membrane is bulging. Tympanic membrane is not injected or erythematous.     Left Ear: Ear canal and external ear normal. No drainage. A middle ear effusion is present. There is no impacted cerumen. Tympanic membrane is bulging. Tympanic membrane is not injected or erythematous.     Ears:     Comments: Bilateral EACs normal, both TMs bulging with clear fluid    Nose: Rhinorrhea present. No nasal deformity, septal deviation, signs of injury, nasal tenderness, mucosal edema or congestion. Rhinorrhea is clear.     Right Nostril: Occlusion present. No foreign body, epistaxis or septal hematoma.     Left Nostril: Occlusion present. No foreign body, epistaxis or septal hematoma.     Right Turbinates: Enlarged, swollen  and pale.     Left Turbinates: Enlarged, swollen and pale.     Right Sinus: No maxillary sinus tenderness or frontal sinus tenderness.     Left Sinus: No maxillary sinus tenderness or frontal sinus tenderness.     Mouth/Throat:     Lips: Pink. No lesions.     Mouth: Mucous membranes are moist. No oral lesions.     Pharynx: Oropharynx is clear. Uvula midline. No posterior oropharyngeal erythema or uvula swelling.     Tonsils: No tonsillar exudate. 0 on the right. 0 on the left.     Comments: Postnasal drip Eyes:     General: Lids are normal.        Right eye: No discharge.        Left eye: No discharge.     Extraocular Movements: Extraocular movements intact.     Conjunctiva/sclera: Conjunctivae normal.     Right eye: Right conjunctiva is not injected.     Left eye: Left conjunctiva is not injected.  Neck:     Trachea: Trachea and phonation normal.  Cardiovascular:     Rate and Rhythm: Normal rate and regular rhythm.     Pulses: Normal pulses.     Heart sounds: Normal heart sounds. No murmur heard.   No friction rub. No gallop.  Pulmonary:     Effort: Pulmonary effort is normal. No accessory muscle usage, prolonged expiration or respiratory distress.     Breath sounds: Normal breath sounds. No stridor, decreased air movement or transmitted upper airway sounds. No decreased breath sounds, wheezing, rhonchi or rales.  Chest:     Chest wall: No tenderness.  Musculoskeletal:        General: Normal range of motion.     Cervical back: Normal range of motion and neck supple. Normal range of motion.  Lymphadenopathy:     Cervical: No cervical adenopathy.  Skin:    General: Skin is warm and dry.     Findings: No erythema or rash.  Neurological:     General: No focal deficit present.     Mental Status: She is alert and oriented to person, place, and time.  Psychiatric:        Mood and Affect: Mood normal.        Behavior: Behavior normal.    Visual Acuity Right Eye Distance:    Left Eye Distance:   Bilateral Distance:    Right Eye Near:   Left Eye Near:    Bilateral Near:     [facility] Couse / Diagnostics /  Procedures:    EKG  Radiology No results found.  Procedures Procedures (including critical care time)  UC Diagnoses / Final Clinical Impressions(s)   I have reviewed the triage vital signs and the nursing notes.  Pertinent labs & imaging results that were available during my care of the patient were reviewed by me and considered in my medical decision making (see chart for details).   Final diagnoses:  Seasonal allergic rhinitis, unspecified trigger  Perennial allergic rhinitis with seasonal variation   We will begin patient on mometasone and fexofenadine, recommend ibuprofen for the next few days as well.  Patient provided with an injection of methylprednisolone for immediate anti-inflammatory relief.  Return precautions advised.  ED Prescriptions     Medication Sig Dispense Auth. Provider   ibuprofen (ADVIL) 400 MG tablet Take 1 tablet (400 mg total) by mouth every 8 (eight) hours as needed for up to 90 doses. 30 tablet Theadora RamaMorgan, Tamaiya Bump Scales, PA-C   fexofenadine (ALLEGRA) 180 MG tablet Take 1 tablet (180 mg total) by mouth daily. 30 tablet Theadora RamaMorgan, Mariska Daffin Scales, PA-C   mometasone (NASONEX) 50 MCG/ACT nasal spray Place 2 sprays into the nose daily. 1 each Theadora RamaMorgan, Yonis Carreon Scales, PA-C      PDMP not reviewed this encounter.  Pending results:  Labs Reviewed - No data to display  Medications Ordered in UC: Medications  methylPREDNISolone sodium succinate (SOLU-MEDROL) 125 mg/2 mL injection 80 mg (80 mg Intramuscular Given 01/09/22 0919)    Disposition Upon Discharge:  Condition: stable for discharge home Home: take medications as prescribed; routine discharge instructions as discussed; follow up as advised.  Patient presented with an acute illness with associated systemic symptoms and significant discomfort requiring urgent management. In my  opinion, this is a condition that a prudent lay person (someone who possesses an average knowledge of health and medicine) may potentially expect to result in complications if not addressed urgently such as respiratory distress, impairment of bodily function or dysfunction of bodily organs.   Routine symptom specific, illness specific and/or disease specific instructions were discussed with the patient and/or caregiver at length.   As such, the patient has been evaluated and assessed, work-up was performed and treatment was provided in alignment with urgent care protocols and evidence based medicine.  Patient/parent/caregiver has been advised that the patient may require follow up for further testing and treatment if the symptoms continue in spite of treatment, as clinically indicated and appropriate.  If the patient was tested for COVID-19, Influenza and/or RSV, then the patient/parent/guardian was advised to isolate at home pending the results of his/her diagnostic coronavirus test and potentially longer if theyre positive. I have also advised pt that if his/her COVID-19 test returns positive, it's recommended to self-isolate for at least 10 days after symptoms first appeared AND until fever-free for 24 hours without fever reducer AND other symptoms have improved or resolved. Discussed self-isolation recommendations as well as instructions for household member/close contacts as per the [facility] and [place] [facility], and also gave patient the COVID packet with this information.  Patient/parent/caregiver has been [facility] in 3-5 days if no better; to PCP or the Emergency Department if new signs and symptoms develop, or if the current signs or symptoms continue to change or worsen for further workup, evaluation and treatment as clinically indicated and appropriate  The patient will follow up with their current PCP if and as advised. If the patient does not currently have a PCP we will assist  them in obtaining one.   The patient may need specialty follow up if the symptoms continue, in spite of conservative treatment and management, for further workup, evaluation, consultation and treatment as clinically indicated and appropriate.  Patient/parent/caregiver verbalized understanding and agreement of plan as discussed.  All questions were addressed during visit.  Please see discharge instructions below for further details of plan.  Discharge Instructions:   Discharge Instructions      For your allergies, I recommend that you begin taking a daily antihistamine, fexofenadine 180 mg tablets.  You can take this day or night, does not make you sleepy.  Please begin nasal steroid, I sent a prescription for Nasonex to your pharmacy.  Please placed 2 sprays in each nare daily.  As we discussed, it is okay if this runs on the back of your throat is because you need this topical medication everywhere you can possibly spread.  This medication should begin to be very effective in the next 2 to 3 days.  While you are waiting for the mometasone to take cold, please take ibuprofen 400 mg 3 times daily for anti-inflammatory relief.  You may notice that this opens up your sinuses allows them to drain more freely.  I have sent your prescription for this medication.  You may also notice that the injection of methylprednisolone that you received in the office gives you a nice boost and expedites this anti-inflammatory process.  It is also recommended that you continue taking Mucinex to help loosen up the congestion and thick mucus, this makes it easier to drain and cough up.  You can take 400 mg 3 times daily.  I have also sent your prescription for this medication.  Thank you very much for visiting urgent care today.  Please return in the next 5 to 7 days if you have not had significant or meaningful relief of your symptoms.      This office note has been dictated using [employer] speech recognition  software.  Unfortunately, and despite my best efforts, this method of dictation can sometimes lead to occasional typographical or grammatical errors.  I apologize in advance if this occurs.     Theadora RamaMorgan, Finian Helvey Scales, [place]PA-C 01/09/22 [phone]

## 2022-01-09 NOTE — Discharge Instructions (Addendum)
For your allergies, I recommend that you begin taking a daily antihistamine, fexofenadine 180 mg tablets.  You can take this day or night, does not make you sleepy.  Please begin nasal steroid, I sent a prescription for Nasonex to your pharmacy.  Please placed 2 sprays in each nare daily.  As we discussed, it is okay if this runs on the back of your throat is because you need this topical medication everywhere you can possibly spread.  This medication should begin to be very effective in the next 2 to 3 days.  While you are waiting for the mometasone to take cold, please take ibuprofen 400 mg 3 times daily for anti-inflammatory relief.  You may notice that this opens up your sinuses allows them to drain more freely.  I have sent your prescription for this medication.  You may also notice that the injection of methylprednisolone that you received in the office gives you a nice boost and expedites this anti-inflammatory process.  It is also recommended that you continue taking Mucinex to help loosen up the congestion and thick mucus, this makes it easier to drain and cough up.  You can take 400 mg 3 times daily.  I have also sent your prescription for this medication.  Thank you very much for visiting urgent care today.  Please return in the next 5 to 7 days if you have not had significant or meaningful relief of your symptoms.

## 2022-05-22 ENCOUNTER — Ambulatory Visit
Admission: EM | Admit: 2022-05-22 | Discharge: 2022-05-22 | Disposition: A | Discharge status: Home / Self Care | Payer: 59 | Attending: Emergency Medicine | Admitting: Emergency Medicine

## 2022-05-22 ENCOUNTER — Encounter: Payer: Self-pay | Admitting: Emergency Medicine

## 2022-05-22 DIAGNOSIS — R0982 Postnasal drip: Secondary | ICD-10-CM | POA: Diagnosis not present

## 2022-05-22 DIAGNOSIS — J029 Acute pharyngitis, unspecified: Secondary | ICD-10-CM | POA: Insufficient documentation

## 2022-05-22 DIAGNOSIS — J309 Allergic rhinitis, unspecified: Secondary | ICD-10-CM | POA: Diagnosis not present

## 2022-05-22 LAB — POCT RAPID STREP A (OFFICE): Rapid Strep A Screen: NEGATIVE

## 2022-05-22 MED ORDER — CETIRIZINE HCL 10 MG PO TABS: 10.0000 mg | ORAL_TABLET | Freq: Every day | ORAL | 2 refills | Status: AC

## 2022-05-22 MED ORDER — FLUTICASONE PROPIONATE 50 MCG/ACT NA SUSP: 1.0000 | Freq: Every day | NASAL | 2 refills | Status: AC

## 2022-05-22 NOTE — ED Triage Notes (Signed)
Pt here with congestion and sore throat since yesterday.

## 2022-05-22 NOTE — ED Provider Notes (Signed)
[facility]    CSN: [id number] Arrival date & time: 05/22/22  1808    HISTORY   Chief Complaint  Patient presents with   Nasal Congestion   Sore Throat   HPI Gwendolyn Lane is a 21 y.o. female. Patient presents to urgent care complaining of congestion and scratchy throat that began yesterday.  Patient states she just got back from a trip to [place].  Patient states she has a history of allergies not currently taking allergy medications.  Patient denies fever, aches, chills, nausea, vomiting, diarrhea, headache, pain with swallowing.  The history is provided by the patient.  Past Medical History:  Diagnosis Date   Asthma    There are no problems to display for this patient.  History reviewed. No pertinent surgical history. OB History   No obstetric history on file.    Home Medications    Prior to Admission medications   Not on File   Family History History reviewed. No pertinent family history. Social History Social History   Tobacco Use   Smoking status: Never   Smokeless tobacco: Never  Vaping Use   Vaping Use: Never used  Substance Use Topics   Alcohol use: Yes   Drug use: Yes    Types: Marijuana   Allergies   Patient has no known allergies.  Review of Systems Review of Systems Pertinent findings noted in history of present illness.   Physical Exam Triage Vital Signs ED Triage Vitals  Enc Vitals Group     BP 10/18/21 0827 (!) 147/82     Pulse Rate 10/18/21 0827 72     Resp 10/18/21 0827 18     Temp 10/18/21 0827 98.3 F (36.8 C)     Temp Source 10/18/21 0827 Oral     SpO2 10/18/21 0827 98 %     Weight --      Height --      Head Circumference --      Peak Flow --      Pain Score 10/18/21 0826 5     Pain Loc --      Pain Edu? --      Excl. in GC? --   No data found.  Updated Vital Signs BP 125/85   Pulse 79   Temp 98.3 F (36.8 C)   Resp 20   SpO2 98%   Physical Exam Vitals and nursing note reviewed.   Constitutional:      General: She is not in acute distress.    Appearance: Normal appearance. She is not ill-appearing.  HENT:     Head: Normocephalic and atraumatic.     Salivary Glands: Right salivary gland is not diffusely enlarged or tender. Left salivary gland is not diffusely enlarged or tender.     Right Ear: Ear canal and external ear normal. No drainage. A middle ear effusion is present. There is no impacted cerumen. Tympanic membrane is bulging. Tympanic membrane is not injected or erythematous.     Left Ear: Ear canal and external ear normal. No drainage. A middle ear effusion is present. There is no impacted cerumen. Tympanic membrane is bulging. Tympanic membrane is not injected or erythematous.     Ears:     Comments: Bilateral EACs normal, both TMs bulging with clear fluid    Nose: Rhinorrhea present. No nasal deformity, septal deviation, signs of injury, nasal tenderness, mucosal edema or congestion. Rhinorrhea is clear.     Right Nostril: Occlusion present. No foreign body, epistaxis or  septal hematoma.     Left Nostril: Occlusion present. No foreign body, epistaxis or septal hematoma.     Right Turbinates: Enlarged, swollen and pale.     Left Turbinates: Enlarged, swollen and pale.     Right Sinus: No maxillary sinus tenderness or frontal sinus tenderness.     Left Sinus: No maxillary sinus tenderness or frontal sinus tenderness.     Mouth/Throat:     Lips: Pink. No lesions.     Mouth: Mucous membranes are moist. No oral lesions.     Pharynx: Oropharynx is clear. Uvula midline. No posterior oropharyngeal erythema or uvula swelling.     Tonsils: No tonsillar exudate. 0 on the right. 0 on the left.     Comments: Postnasal drip Eyes:     General: Lids are normal.        Right eye: No discharge.        Left eye: No discharge.     Extraocular Movements: Extraocular movements intact.     Conjunctiva/sclera: Conjunctivae normal.     Right eye: Right conjunctiva is not  injected.     Left eye: Left conjunctiva is not injected.  Neck:     Trachea: Trachea and phonation normal.  Cardiovascular:     Rate and Rhythm: Normal rate and regular rhythm.     Pulses: Normal pulses.     Heart sounds: Normal heart sounds. No murmur heard.   No friction rub. No gallop.  Pulmonary:     Effort: Pulmonary effort is normal. No accessory muscle usage, prolonged expiration or respiratory distress.     Breath sounds: Normal breath sounds. No stridor, decreased air movement or transmitted upper airway sounds. No decreased breath sounds, wheezing, rhonchi or rales.  Chest:     Chest wall: No tenderness.  Musculoskeletal:        General: Normal range of motion.     Cervical back: Normal range of motion and neck supple. Normal range of motion.  Lymphadenopathy:     Cervical: No cervical adenopathy.  Skin:    General: Skin is warm and dry.     Findings: No erythema or rash.  Neurological:     General: No focal deficit present.     Mental Status: She is alert and oriented to person, place, and time.  Psychiatric:        Mood and Affect: Mood normal.        Behavior: Behavior normal.    Visual Acuity Right Eye Distance:   Left Eye Distance:   Bilateral Distance:    Right Eye Near:   Left Eye Near:    Bilateral Near:     UC Couse / Diagnostics / Procedures:    EKG  Radiology No results found.  Procedures Procedures (including critical care time)  UC Diagnoses / Final Clinical Impressions(s)   I have reviewed the triage vital signs and the nursing notes.  Pertinent labs & imaging results that were available during my care of the patient were reviewed by me and considered in my medical decision making (see chart for details).   Final diagnoses:  Acute pharyngitis, unspecified etiology  Allergic rhinitis, unspecified seasonality, unspecified trigger  Postnasal drip   Rapid strep test is negative, throat culture ordered per protocol but I have very low  suspicion for strep infection.  Patient provided with prescriptions for allergy medications.  Return precautions advised.  ED Prescriptions     Medication Sig Dispense Auth. Provider   cetirizine (ZYRTEC ALLERGY) 10  MG tablet Take 1 tablet (10 mg total) by mouth at bedtime. 30 tablet Theadora Rama Scales, PA-C   fluticasone (FLONASE) 50 MCG/ACT nasal spray Place 1 spray into both nostrils daily. Begin by using 2 sprays in each nare daily for 3 to 5 days, then decrease to 1 spray in each nare daily. 16 mL Theadora Rama Scales, PA-C      PDMP not reviewed this encounter.  Pending results:  Labs Reviewed  POCT RAPID STREP A (OFFICE) - Normal  CULTURE, GROUP A STREP [facility])    Medications Ordered in UC: Medications - No data to display  Disposition Upon Discharge:  Condition: stable for discharge home Home: take medications as prescribed; routine discharge instructions as discussed; follow up as advised.  Patient presented with an acute illness with associated systemic symptoms and significant discomfort requiring urgent management. In my opinion, this is a condition that a prudent lay person (someone who possesses an average knowledge of health and medicine) may potentially expect to result in complications if not addressed urgently such as respiratory distress, impairment of bodily function or dysfunction of bodily organs.   Routine symptom specific, illness specific and/or disease specific instructions were discussed with the patient and/or caregiver at length.   As such, the patient has been evaluated and assessed, work-up was performed and treatment was provided in alignment with urgent care protocols and evidence based medicine.  Patient/parent/caregiver has been advised that the patient may require follow up for further testing and treatment if the symptoms continue in spite of treatment, as clinically indicated and appropriate.  If the patient was tested for COVID-19, Influenza  and/or RSV, then the patient/parent/guardian was advised to isolate at home pending the results of his/her diagnostic coronavirus test and potentially longer if they're positive. I have also advised pt that if his/her COVID-19 test returns positive, it's recommended to self-isolate for at least 10 days after symptoms first appeared AND until fever-free for 24 hours without fever reducer AND other symptoms have improved or resolved. Discussed self-isolation recommendations as well as instructions for household member/close contacts as per the [facility] and [place] [facility], and also gave patient the COVID packet with this information.  Patient/parent/caregiver has been advised to return to the [facility] or PCP in 3-5 days if no better; to PCP or the Emergency Department if new signs and symptoms develop, or if the current signs or symptoms continue to change or worsen for further workup, evaluation and treatment as clinically indicated and appropriate  The patient will follow up with their current PCP if and as advised. If the patient does not currently have a PCP we will assist them in obtaining one.   The patient may need specialty follow up if the symptoms continue, in spite of conservative treatment and management, for further workup, evaluation, consultation and treatment as clinically indicated and appropriate.  Patient/parent/caregiver verbalized understanding and agreement of plan as discussed.  All questions were addressed during visit.  Please see discharge instructions below for further details of plan.  Discharge Instructions:   Discharge Instructions      Your symptoms and my physical exam findings are concerning for exacerbation of your underlying allergies.  Your strep test today is negative.   Please see the list below for recommended medications, dosages and frequencies to provide relief of current symptoms:     Zyrtec (cetirizine): This is an excellent second-generation antihistamine that helps to  reduce respiratory inflammatory response to environmental allergens.  In some patients, this medication can  cause daytime sleepiness so I recommend that you take 1 tablet daily at bedtime.     Flonase (fluticasone): This is a steroid nasal spray that you use once daily, 1 spray in each nare.  This medication does not work well if you decide to use it only used as you feel you need to, it works best used on a daily basis.  After 3 to 5 days of use, you will notice significant reduction of the inflammation and mucus production that is currently being caused by exposure to allergens, whether seasonal or environmental.  The most common side effect of this medication is nosebleeds.  If you experience a nosebleed, please discontinue use for 1 week, then feel free to resume.  I have provided you with a prescription.    If your insurance will not cover your allergy medications, please consider downloading the Good Rx app which is free.  You can find considerable discounts on prescription and over-the-counter medications.   If you find that you have not had improvement of your symptoms in the next 5 to 7 days, please follow-up with your primary care provider or return here to urgent care for repeat evaluation and further recommendations.   Thank you for visiting urgent care today.  We appreciate the opportunity to participate in your care.       This office note has been dictated using Teaching laboratory technician.  Unfortunately, and despite my best efforts, this method of dictation can sometimes lead to occasional typographical or grammatical errors.  I apologize in advance if this occurs.     Theadora Rama Scales, PA-C 05/22/22 1843

## 2022-05-22 NOTE — Discharge Instructions (Signed)
Your symptoms and my physical exam findings are concerning for exacerbation of your underlying allergies.  Your strep test today is negative.   Please see the list below for recommended medications, dosages and frequencies to provide relief of current symptoms:     Zyrtec (cetirizine): This is an excellent second-generation antihistamine that helps to reduce respiratory inflammatory response to environmental allergens.  In some patients, this medication can cause daytime sleepiness so I recommend that you take 1 tablet daily at bedtime.     Flonase (fluticasone): This is a steroid nasal spray that you use once daily, 1 spray in each nare.  This medication does not work well if you decide to use it only used as you feel you need to, it works best used on a daily basis.  After 3 to 5 days of use, you will notice significant reduction of the inflammation and mucus production that is currently being caused by exposure to allergens, whether seasonal or environmental.  The most common side effect of this medication is nosebleeds.  If you experience a nosebleed, please discontinue use for 1 week, then feel free to resume.  I have provided you with a prescription.    If your insurance will not cover your allergy medications, please consider downloading the Good Rx app which is free.  You can find considerable discounts on prescription and over-the-counter medications.   If you find that you have not had improvement of your symptoms in the next 5 to 7 days, please follow-up with your primary care provider or return here to urgent care for repeat evaluation and further recommendations.   Thank you for visiting urgent care today.  We appreciate the opportunity to participate in your care.

## 2022-05-26 LAB — CULTURE, GROUP A STREP (THRC)
